# Patient Record
Sex: Female | Born: 1965 | Race: Black or African American | Hispanic: No | Marital: Single | Smoking: Never smoker
Health system: Southern US, Community
[De-identification: ages and names within clinical notes are randomized; demographics above are authoritative.]

## PROBLEM LIST (undated history)

## (undated) DIAGNOSIS — E559 Vitamin D deficiency, unspecified: Secondary | ICD-10-CM

## (undated) DIAGNOSIS — K219 Gastro-esophageal reflux disease without esophagitis: Secondary | ICD-10-CM

## (undated) HISTORY — PX: ULNAR COLLATERAL LIGAMENT REPAIR: SHX6159

## (undated) HISTORY — DX: Vitamin D deficiency, unspecified: E55.9

---

## 2003-05-02 HISTORY — PX: PARTIAL HYSTERECTOMY: SHX80

## 2013-05-01 HISTORY — PX: CHOLECYSTECTOMY: SHX55

## 2014-11-26 ENCOUNTER — Other Ambulatory Visit (HOSPITAL_COMMUNITY)
Admission: RE | Admit: 2014-11-26 | Discharge: 2014-11-26 | Disposition: A | Discharge status: Home / Self Care | Payer: Managed Care, Other (non HMO) | Source: Ambulatory Visit | Attending: Dermatology | Admitting: Dermatology

## 2014-11-26 DIAGNOSIS — L659 Nonscarring hair loss, unspecified: Secondary | ICD-10-CM | POA: Diagnosis not present

## 2014-11-26 DIAGNOSIS — L299 Pruritus, unspecified: Secondary | ICD-10-CM | POA: Diagnosis present

## 2014-11-26 DIAGNOSIS — L989 Disorder of the skin and subcutaneous tissue, unspecified: Secondary | ICD-10-CM | POA: Insufficient documentation

## 2014-11-26 LAB — TSH: TSH: 2.994 u[IU]/mL (ref 0.350–4.500)

## 2014-11-26 LAB — FERRITIN: FERRITIN: 42 ng/mL (ref 11–307)

## 2014-11-27 LAB — T4: T4 TOTAL: 6 ug/dL (ref 4.5–12.0)

## 2014-11-28 LAB — T3 UPTAKE: T3 UPTAKE RATIO: 26 % (ref 24–39)

## 2019-03-13 ENCOUNTER — Other Ambulatory Visit: Payer: Self-pay | Admitting: Family Medicine

## 2019-03-13 DIAGNOSIS — R519 Headache, unspecified: Secondary | ICD-10-CM

## 2019-03-13 DIAGNOSIS — R42 Dizziness and giddiness: Secondary | ICD-10-CM

## 2019-03-13 DIAGNOSIS — R569 Unspecified convulsions: Secondary | ICD-10-CM

## 2019-04-02 ENCOUNTER — Ambulatory Visit
Admission: RE | Admit: 2019-04-02 | Discharge: 2019-04-02 | Disposition: A | Discharge status: Home / Self Care | Payer: Managed Care, Other (non HMO) | Source: Ambulatory Visit | Attending: Family Medicine | Admitting: Family Medicine

## 2019-04-02 ENCOUNTER — Other Ambulatory Visit: Payer: Self-pay

## 2019-04-02 DIAGNOSIS — R569 Unspecified convulsions: Secondary | ICD-10-CM

## 2019-04-02 DIAGNOSIS — R42 Dizziness and giddiness: Secondary | ICD-10-CM

## 2019-04-02 DIAGNOSIS — R519 Headache, unspecified: Secondary | ICD-10-CM

## 2019-04-02 MED ORDER — GADOBENATE DIMEGLUMINE 529 MG/ML IV SOLN
20.0000 mL | Freq: Once | INTRAVENOUS | Status: AC | PRN
  Administered 2019-04-02: 20 mL via INTRAVENOUS

## 2019-05-02 DIAGNOSIS — R7303 Prediabetes: Secondary | ICD-10-CM

## 2019-05-02 HISTORY — DX: Prediabetes: R73.03

## 2019-05-22 ENCOUNTER — Encounter: Payer: Self-pay | Admitting: Neurology

## 2019-05-22 ENCOUNTER — Ambulatory Visit: Payer: BC Managed Care – PPO | Admitting: Neurology

## 2019-05-22 ENCOUNTER — Other Ambulatory Visit: Payer: Self-pay

## 2019-05-22 VITALS — BP 141/90 | HR 82 | Temp 97.6°F | Ht 70.5 in | Wt 322.0 lb

## 2019-05-22 DIAGNOSIS — R42 Dizziness and giddiness: Secondary | ICD-10-CM

## 2019-05-22 DIAGNOSIS — G44099 Other trigeminal autonomic cephalgias (TAC), not intractable: Secondary | ICD-10-CM

## 2019-05-22 DIAGNOSIS — G44021 Chronic cluster headache, intractable: Secondary | ICD-10-CM | POA: Diagnosis not present

## 2019-05-22 MED ORDER — INDOMETHACIN ER 75 MG PO CPCR
75.0000 mg | ORAL_CAPSULE | Freq: Two times a day (BID) | ORAL | 1 refills | Status: DC
Start: 2019-05-22 — End: 2023-09-07

## 2019-05-22 MED ORDER — MECLIZINE HCL 25 MG PO TABS: 25.0000 mg | ORAL_TABLET | Freq: Two times a day (BID) | ORAL | 1 refills | Status: DC | PRN

## 2019-05-22 NOTE — Patient Instructions (Signed)
Meclizine twice daily as needed Indomethacin twice daily email me in 5-7 days with update   Indomethacin-Responsive Headache, Adult An indomethacin-responsive headache is a headache that gets better when you take indomethacin. Indomethacin is a kind of NSAID (nonsteroidal anti-inflammatory drug). Indomethacin can quickly stop the pain from some kinds of headaches, such as:  Paroxysmal hemicrania. This is a series of short, severe headaches, usually on just one side of the head.  Hemicrania continua. Pain is nonstop and on one side of the face.  Primary exertional headache. Exercise sets off these headaches.  Primary cough headache. Pain may come from pressure in the brain when coughing or straining. What are the causes? The exact cause of this condition is not known. Certain conditions may start (trigger) a headache. They include:  Moving the head in certain ways.  Stress.  Pressure on sensitive areas of the neck.  Drinking alcohol.  Exercise.  Coughing and sneezing. What increases the risk? The following factors may make you more likely to develop this condition:  Being 52 years of age or older.  Having a serious head injury.  Having migraine headaches.  Having a family history of this condition. What are the signs or symptoms? Symptoms of this condition depend on the kind of headache you have.  Paroxysmal hemicrania: ? Having about 10 headaches a day. Each may last from a few minutes to 2 hours. ? Severe, pounding pain. ? Pain usually on just one side of the head. It often centers around the eye or in the forehead. ? A watery eye, which may become red or swollen. ? A droopy or swollen eyelid. ? Sweating and having a red or pinkish face. ? A stuffy, runny nose.  Hemicrania continua: ? All-day headache. This may occur daily for at least 3 months. Then there may be no headaches for weeks or months. ? Pain that gets worse several times during the day. ? Pain in the  face, on one side only. It almost always occurs on the same side. ? A watery eye. It may also become droopy, red, and swollen. ? A stuffy, runny nose. ? Pain that gets worse with sound or light.  Primary exertional headache: ? Pain during physical activity. ? Pounding or throbbing pain. ? Pain that lasts for 5 minutes to 48 hours, or sometimes longer.  Primary cough headache: ? Pain that starts after coughing, sneezing, or straining. ? Sharp, stabbing pain. ? Pain on both sides of the head. It is often worse in the back of the head. ? Pain that is severe for a few minutes and then dull for several hours. How is this diagnosed? This condition may be diagnosed based on:  Symptoms and medical history. Your health care provider will ask you questions about your headaches.  Physical exam.  Tests that may include: ? Blood and urine tests. ? Spinal tap (lumbar puncture). This tests a sample of fluid from your spine. The test checks for infection, bleeding in your brain (brain hemorrhage), or extra pressure inside your skull. ? Ultrasound, MRI, CT scan, or other imaging tests. If no medical condition is causing your headaches, you will be given indomethacin. If yours is an indomethacin-responsive headache, your symptoms should go away quickly. How is this treated?  By taking indomethacin.  With other medicines: ? To prevent or treat stomach ulcers. ? To relieve stomachache or heartburn (antacids). ? For nausea. Follow these instructions at home: Lifestyle  Rest in a dark, quiet room.  Put a  cool, damp washcloth on your head or face.  Get plenty of sleep. Most adults should get at least 7-9 hours of sleep each night.  Eat on a regular schedule. Do not skip meals.  Limit alcohol intake to no more than 1 drink per day for non-pregnant women and 2 drinks per day for men. One drink equals 12 ounces of beer, 5 ounces of wine, or 1 ounces of hard liquor.  Do not use any products  that contain nicotine or tobacco, such as cigarettes and e-cigarettes. If you need help quitting, ask your health care provider. Headache diary Keep a headache diary. This will help you and your health care provider determine what is triggering your headaches. Each time you have a headache, write down:  When it started and stopped. Include the day and time.  How it felt.  Any triggers, such as noise, stress, or foods.  Any medicines you took.  General instructions  Take over-the-counter and prescription medicines only as told by your health care provider. ? Do not take other NSAIDs, such as ibuprofen, with indomethacin.  Tell your health care provider about all medicines you are taking, including vitamins, herbs, eye drops, creams, and over-the-counter medicines.  Keep all follow-up visits as told by your health care provider. This is important. Contact a health care provider if:  Your pain continues even with treatment.  You have nausea.  You have a fever. Get help right away if:  You have bad stomach pain or vomiting.  You vomit blood.  You have blood in your stool.  You have chest pain.  You have any symptoms of a stroke. "BE FAST" is an easy way to remember the main warning signs of a stroke: ? B - Balance. Signs are dizziness, sudden trouble walking, or loss of balance. ? E - Eyes. Signs are trouble seeing or a sudden change in vision. ? F - Face. Signs are sudden weakness or numbness of the face, or the face or eyelid drooping on one side. ? A - Arms. Signs are weakness or numbness in an arm. This happens suddenly and usually on one side of the body. ? S - Speech. Signs are sudden trouble speaking, slurred speech, or trouble understanding what people say. ? T - Time. Time to call emergency services. Write down what time symptoms started.  You have other signs of a stroke, such as: ? A sudden, severe headache. ? Nausea or vomiting. ? Seizure. Summary  An  indomethacin-responsive headache is a headache that gets better when you take indomethacin, a medicine that stops inflammation.  The exact cause of this condition is not known, but there are certain conditions that may start (trigger) a headache.  Keep a headache diary to help your health care provider determine your triggers.  Treatment of this condition includes indomethacin, but it may include other medicines to relieve other symptoms. This information is not intended to replace advice given to you by your health care provider. Make sure you discuss any questions you have with your health care provider. Document Revised: 06/10/2018 Document Reviewed: 04/28/2017 Elsevier Patient Education  2020 [employer].

## 2019-05-22 NOTE — Progress Notes (Signed)
[facility]    Provider:  Dr Lucia Gaskins Requesting Provider: Irena Reichmann, DO Primary Care Provider:  Irena Reichmann, DO  CC:  Headaches  HPI:  Karina Roy is a 54 y.o. female here as requested by Irena Reichmann, DO for headaches. PMHx pre-diabetes  Started about 20 after hitting her head, they start with dizziness, sharp pains more on the right behind the eye, her eye gets twitchy, watery, all day long continuous, no light sensitivity, no sound sensitivity, pain is sharp, brief, she has spinning, no nausea unless it is really bad, her maternal aunt had migraines but no cluster headaches, no migraines in mom or siblings, she doesn't have children. Alleve helps. She will get them every other day, always hours and hours and hours. She takes meclizine daily. She took sumatriptan and she couldn't talk, chest pain, dizziness. No other focal neurologic deficits, associated symptoms, inciting events or modifiable factors.  Reviewed notes, labs and imaging from outside physicians, which showed:  Personally reviewed MRI brain images and agree with the following: IMPRESSION: 1. No evidence of acute intracranial abnormality. 2. Single small focus of T2 hyperintensity within the left frontal lobe white matter. This is nonspecific, but may reflect minimal chronic small vessel ischemic disease. 3. No specific seizure focus is identified.  TSH normal  Review of Systems: Patient complains of symptoms per HPI as well as the following symptoms: headache. Pertinent negatives and positives per HPI. All others negative.   Social History   Socioeconomic History  . Marital status: Single    Spouse name: Not on file  . Number of children: 1  . Years of education: Not on file  . Highest education level: Some college, no degree  Occupational History  . Not on file  Tobacco Use  . Smoking status: Never Smoker  . Smokeless tobacco: Never Used  Substance and Sexual Activity  . Alcohol  use: Not Currently  . Drug use: Never  . Sexual activity: Not on file  Other Topics Concern  . Not on file  Social History Narrative   Lives at home with her son (nephew whom she has had custody of since he was young)   Right handed   Caffeine: tea 3-4 times a month, sodas 4-5 per week   Social Determinants of Health   Financial Resource Strain:   . Difficulty of Paying Living Expenses: Not on file  Food Insecurity:   . Worried About Programme researcher, broadcasting/film/video in the Last Year: Not on file  . Ran Out of Food in the Last Year: Not on file  Transportation Needs:   . Lack of Transportation (Medical): Not on file  . Lack of Transportation (Non-Medical): Not on file  Physical Activity:   . Days of Exercise per Week: Not on file  . Minutes of Exercise per Session: Not on file  Stress:   . Feeling of Stress : Not on file  Social Connections:   . Frequency of Communication with Friends and Family: Not on file  . Frequency of Social Gatherings with Friends and Family: Not on file  . Attends Religious Services: Not on file  . Active Member of Clubs or Organizations: Not on file  . Attends Banker Meetings: Not on file  . Marital Status: Not on file  Intimate Partner Violence:   . Fear of Current or Ex-Partner: Not on file  . Emotionally Abused: Not on file  . Physically Abused: Not on file  . Sexually Abused: Not  on file    Family History  Problem Relation Age of Onset  . Lung cancer Maternal Grandmother        smoker  . Diabetes Other        mothers side   . High blood pressure Other        mother's side   . Diabetes Maternal Aunt   . Migraines Maternal Aunt        when younger, required hospital admission    Past Medical History:  Diagnosis Date  . Pre-diabetes 05/2019   A1c 6.2 or 6.3 per pt report  . Vitamin D deficiency     Patient Active Problem List   Diagnosis Date Noted  . 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 CHOLECYSTECTOMY  2015  . PARTIAL HYSTERECTOMY  2005   still has ovaries    Current Outpatient Medications  Medication Sig Dispense Refill  . ibuprofen (ADVIL) 600 MG tablet Take 600 mg by mouth every 6 (six) hours as needed for pain.    . indomethacin (INDOCIN SR) 75 MG CR capsule Take 1 capsule (75 mg total) by mouth 2 (two) times daily with a meal. 60 capsule 1  . meclizine (ANTIVERT) 25 MG tablet Take 1-2 tablets (25-50 mg total) by mouth 2 (two) times daily as needed for dizziness. 120 tablet 1  . Vitamin D, Ergocalciferol, (DRISDOL) 1.25 MG (50000 UNIT) CAPS capsule Take 50,000 Units by mouth once a week.     No current facility-administered medications for this visit.    Allergies as of 05/22/2019 - Review Complete 05/22/2019  Allergen Reaction Noted  . Sumatriptan  05/22/2019    Vitals: BP (!) 141/90 (BP Location: Right Arm, Patient Position: Sitting, Cuff Size: Large)   Pulse 82   Temp 97.6 F (36.4 C) Comment: taken at front  Ht 5' 10.5" (1.791 m)   Wt (!) 322 lb (146.1 kg)   BMI 45.55 kg/m  Last Weight:  Wt Readings from Last 1 Encounters:  05/22/19 (!) 322 lb (146.1 kg)   Last Height:   Ht Readings from Last 1 Encounters:  05/22/19 5' 10.5" (1.791 m)     Physical exam: Exam: Gen: NAD, conversant, well nourised, morbidly obese, well groomed                     CV: RRR, no MRG. No Carotid Bruits. No peripheral edema, warm, nontender Eyes: Conjunctivae clear without exudates or hemorrhage  Neuro: Detailed Neurologic Exam  Speech:    Speech is normal; fluent and spontaneous with normal comprehension.  Cognition:    The patient is oriented to person, place, and time;     recent and remote memory intact;     language fluent;     normal attention, concentration,     fund of knowledge Cranial Nerves:    The pupils are equal, round, and reactive to light. The fundi are normal and spontaneous venous pulsations are present.  Visual fields are full to finger confrontation. Extraocular movements are intact. Trigeminal sensation is intact and the muscles of mastication are normal. The face is symmetric. The palate elevates in the midline. Hearing intact. Voice is normal. Shoulder shrug is normal. The tongue has normal motion without fasciculations.   Coordination:    Normal finger to nose    Gait:    Wide based due to extremely large body habitus  Motor Observation:    No asymmetry, no  atrophy, and no involuntary movements noted. Tone:    Normal muscle tone.    Posture:    Posture is normal. normal erect    Strength:    Strength is V/V in the upper and lower limbs.      Sensation: intact to LT     Reflex Exam:  DTR's:    Deep tendon reflexes in the upper and lower extremities are normal bilaterally.   Toes:    The toes are downgoing bilaterally.   Clonus:    Clonus is absent.    Assessment/Plan: This is a very lovely 54 year old female who is here for headaches that appear to be in the trigeminal autonomic cephalalgia group.  She describes the headache on the right side only, periorbital and temple, severe and sharp, with right eye ptosis and lacrimation, without significant migrainous features.  The most common cause of this kind of headaches are cluster headaches however she reports these headaches can continue all day long uninterrupted and continuous, and cluster headaches by definition are 15 to 180 minutes in duration.  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  She states she has done very well with meclizine as well so I am happy to refill that at a higher dose that she can take as needed.  If the indomethacin it does not improve her headache then I would try verapamil which is first-line for cluster headaches.  Patient is going to contact me in 5 to 7 days to let me know  if she is indomethacin responsive.  MRI of the brain was normal.  Orders Placed This Encounter  Procedures  . CBC  . Comprehensive metabolic panel   Meds ordered this encounter  Medications  . meclizine (ANTIVERT) 25 MG tablet    Sig: Take 1-2 tablets (25-50 mg total) by mouth 2 (two) times daily as needed for dizziness.    Dispense:  120 tablet    Refill:  1  . indomethacin (INDOCIN SR) 75 MG CR capsule    Sig: Take 1 capsule (75 mg total) by mouth 2 (two) times daily with a meal.    Dispense:  60 capsule    Refill:  1    Cc: Irena Reichmann, DO,    Naomie Dean, MD  [facility] [address] [place], [place] [address]  Phone [phone] Fax [phone]

## 2019-05-23 LAB — COMPREHENSIVE METABOLIC PANEL
ALT: 23 IU/L (ref 0–32)
AST: 17 IU/L (ref 0–40)
Albumin/Globulin Ratio: 1.4 (ref 1.2–2.2)
Albumin: 4.1 g/dL (ref 3.8–4.9)
Alkaline Phosphatase: 69 IU/L (ref 39–117)
BUN/Creatinine Ratio: 12 (ref 9–23)
BUN: 9 mg/dL (ref 6–24)
Bilirubin Total: <0.2 mg/dL (ref 0.0–1.2)
CO2: 23 mmol/L (ref 20–29)
Calcium: 10.5 mg/dL — ABNORMAL HIGH (ref 8.7–10.2)
Chloride: 107 mmol/L — ABNORMAL HIGH (ref 96–106)
Creatinine, Ser: 0.73 mg/dL (ref 0.57–1.00)
GFR calc Af Amer: 110 mL/min/{1.73_m2} (ref >59)
GFR calc non Af Amer: 96 mL/min/{1.73_m2} (ref >59)
Globulin, Total: 2.9 g/dL (ref 1.5–4.5)
Glucose: 100 mg/dL — ABNORMAL HIGH (ref 65–99)
Potassium: 4.4 mmol/L (ref 3.5–5.2)
Sodium: 143 mmol/L (ref 134–144)
Total Protein: 7 g/dL (ref 6.0–8.5)

## 2019-05-23 LAB — CBC
Hematocrit: 39 % (ref 34.0–46.6)
Hemoglobin: 12.8 g/dL (ref 11.1–15.9)
MCH: 27.7 pg (ref 26.6–33.0)
MCHC: 32.8 g/dL (ref 31.5–35.7)
MCV: 84 fL (ref 79–97)
Platelets: 259 10*3/uL (ref 150–450)
RBC: 4.62 x10E6/uL (ref 3.77–5.28)
RDW: 14.1 % (ref 11.7–15.4)
WBC: 4.3 10*3/uL (ref 3.4–10.8)

## 2019-05-23 NOTE — Progress Notes (Signed)
Labs unremarkable thanks

## 2019-05-27 ENCOUNTER — Telehealth: Payer: Self-pay | Admitting: *Deleted

## 2019-05-27 NOTE — Telephone Encounter (Signed)
Called pt, LVM (ok per DPR) advising labs are unremarkable, no concerns. Left office number in message for call back if she has any questions.

## 2019-05-27 NOTE — Telephone Encounter (Signed)
-----   Message from Anson Fret, MD sent at 05/23/2019 10:12 AM EST ----- Labs unremarkable thanks

## 2021-01-21 IMAGING — MR MR HEAD WO/W CM
14 series · 48 of 48 positions shown · IV contrast (multihance)
Comparison: No pertinent prior studies available for comparison.

CLINICAL DATA: Dizziness. Convulsions, unspecified convulsion type.
Non intractable headache, unspecified chronicity pattern,
unspecified headache type. Additional history provided: Headaches
and dizziness for 8-9 months. History of seizures as a teenager.
Fainting, difficulty walking.

EXAM:
MRI HEAD WITHOUT AND WITH CONTRAST
TECHNIQUE: Multiplanar, multiecho pulse sequences of the brain and surrounding
structures were obtained without and with intravenous contrast.
CONTRAST:  20mL MULTIHANCE GADOBENATE DIMEGLUMINE 529 MG/ML IV SOLN

[Series 5: T1 · sagittal · 4.0mm · 0.75mm/px · 2 of 31 slices shown (1 of 3)]
[im 1/31]
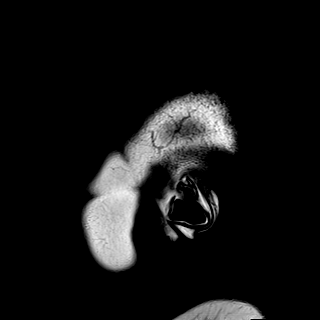
[im 31/31]
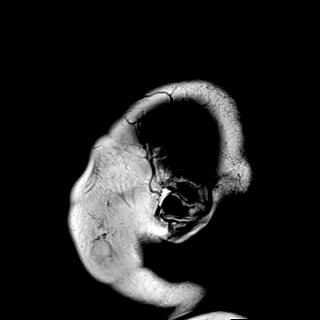

[Series 6: DWI · axial · 3.0mm · 1.44mm/px · z∈[-49,+92]mm · 5 of 88 slices shown (1 of 4)]
[im 1/88]
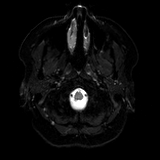
[im 22/88]
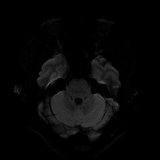
[im 44/88]
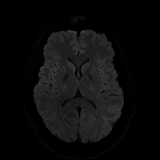
[im 66/88]
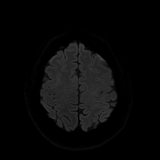
[im 88/88]
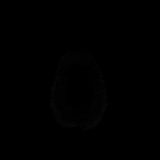

[Series 7: DWI · axial · 3.0mm · 1.44mm/px · z∈[-49,+92]mm · 2 of 44 slices shown (2 of 4)]
[im 1/44]
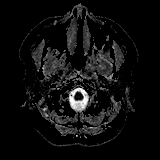
[im 44/44]
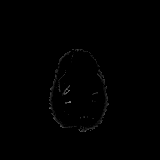

[Series 8: DWI · coronal · 5.0mm · 1.44mm/px · 4 of 64 slices shown (3 of 4)]
[im 1/64]
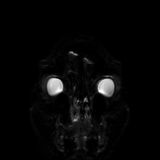
[im 22/64]
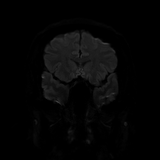
[im 43/64]
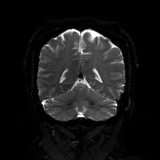
[im 64/64]
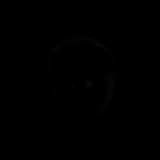

[Series 9: DWI · coronal · 5.0mm · 1.44mm/px · 2 of 32 slices shown (4 of 4)]
[im 1/32]
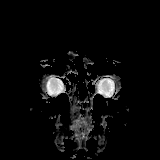
[im 32/32]
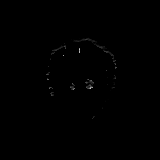

[Series 10: T2 · axial · 4.0mm · 0.36mm/px · z∈[-55,+90]mm · 2 of 29 slices shown (1 of 2)]
[im 1/29]
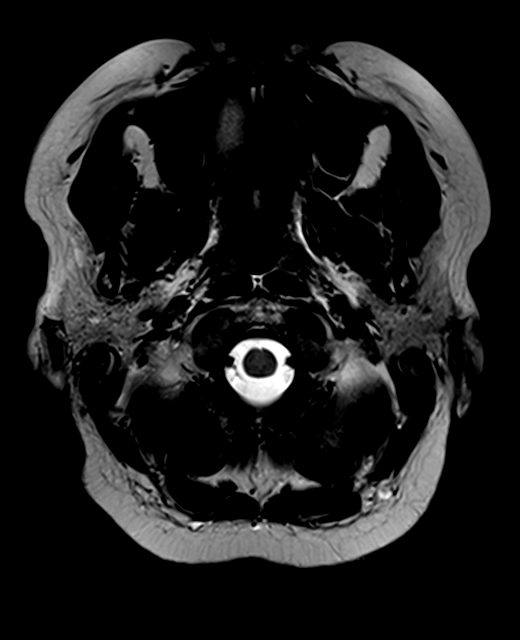
[im 29/29]
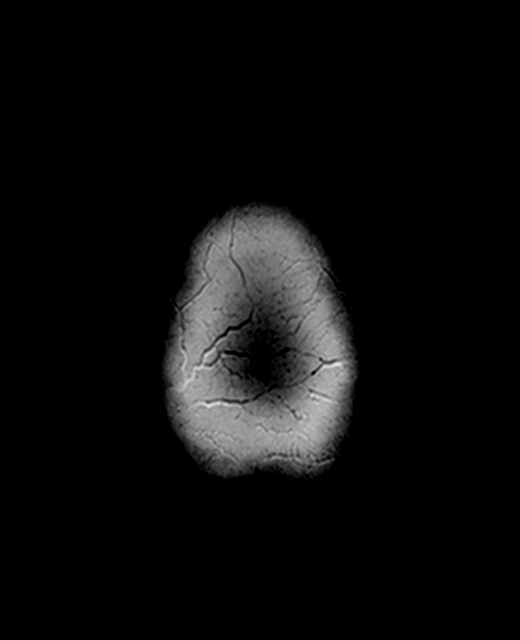

[Series 11: FLAIR · axial · 3.0mm · 0.72mm/px · 1 of 26 slices shown (1 of 2)]
[im 1/26]
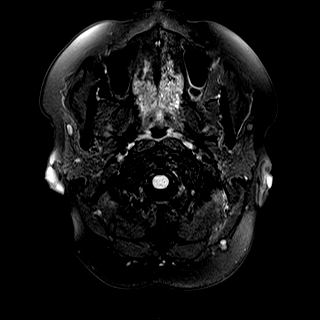

[Series 13: swi_images · axial · 1.5mm · 0.90mm/px · z∈[-53,+89]mm · 5 of 96 slices shown]
[im 1/96]
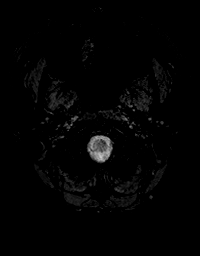
[im 24/96]
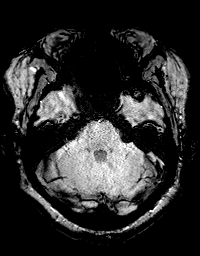
[im 48/96]
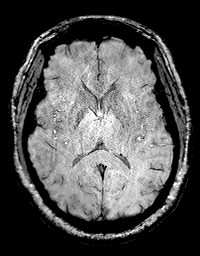
[im 72/96]
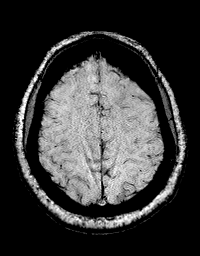
[im 96/96]
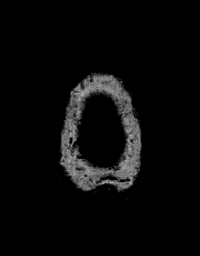

[Series 14: T1 · axial · 1.0mm · 0.94mm/px · z∈[-71,+88]mm · 9 of 160 slices shown (2 of 3)]
[im 1/160]
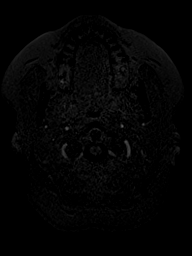
[im 20/160]
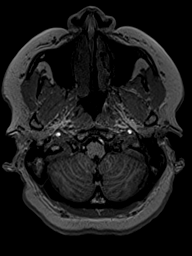
[im 40/160]
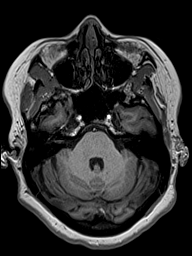
[im 60/160]
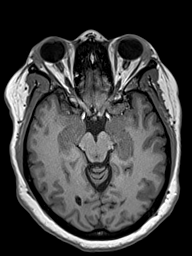
[im 80/160]
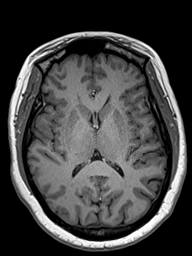
[im 100/160]
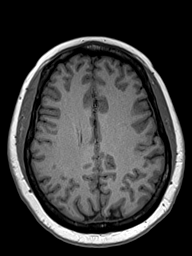
[im 120/160]
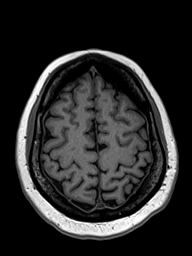
[im 140/160]
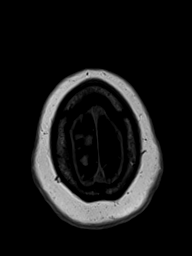
[im 160/160]
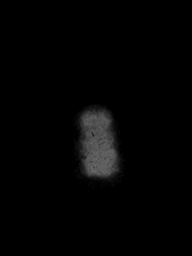

[Series 15: T2 · coronal · 3.0mm · 0.47mm/px · 1 of 25 slices shown (2 of 2)]
[im 1/25]
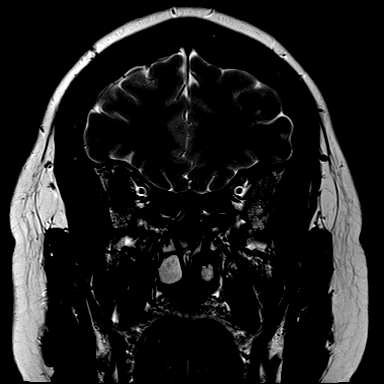

[Series 16: FLAIR · coronal · 3.0mm · 0.56mm/px · 2 of 30 slices shown (2 of 2)]
[im 1/30]
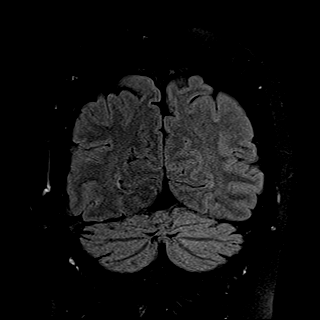
[im 30/30]
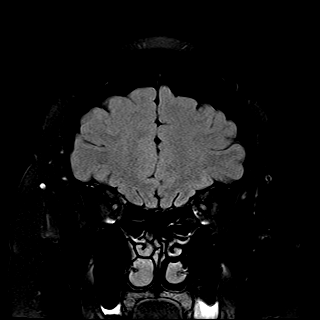

[Series 17: T2 post-contrast · coronal · 4.0mm · 0.36mm/px · 2 of 35 slices shown]
[im 1/35]
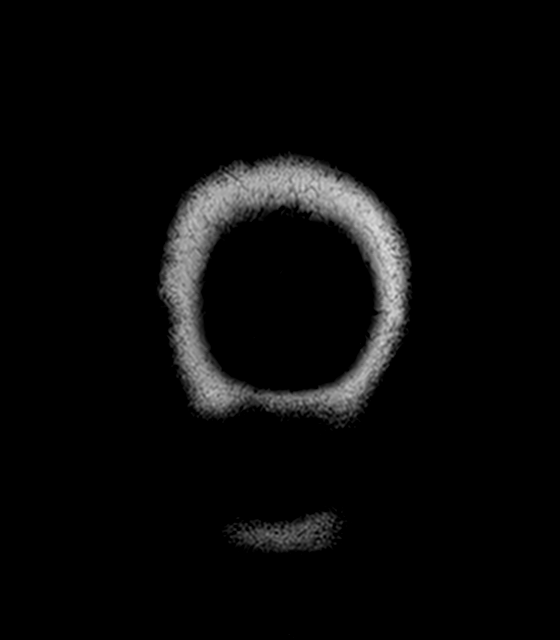
[im 35/35]
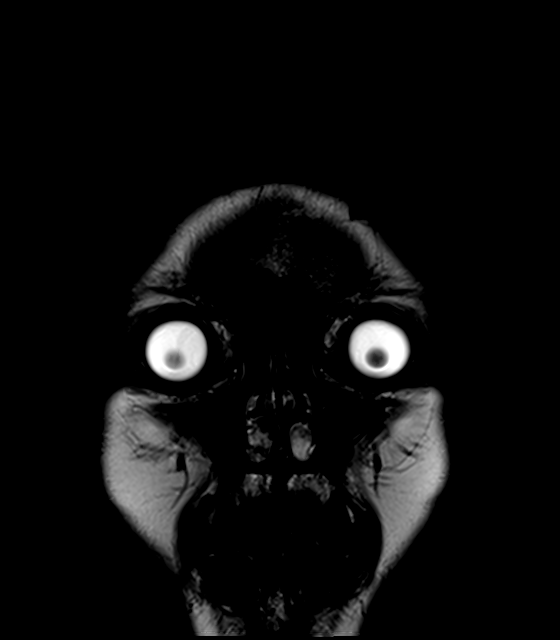

[Series 18: T1 · axial · 1.0mm · 0.94mm/px · z∈[-71,+88]mm · 9 of 160 slices shown (3 of 3)]
[im 1/160]
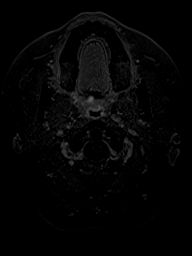
[im 20/160]
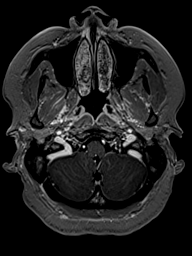
[im 40/160]
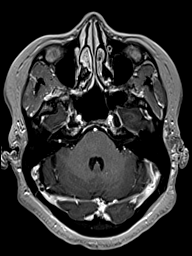
[im 60/160]
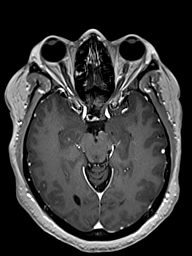
[im 80/160]
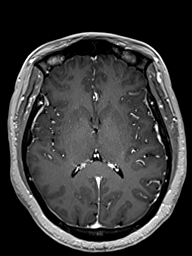
[im 100/160]
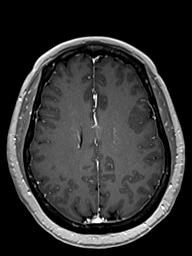
[im 120/160]
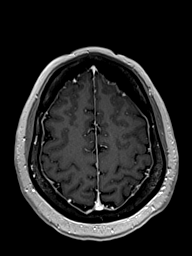
[im 140/160]
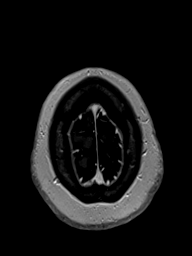
[im 160/160]
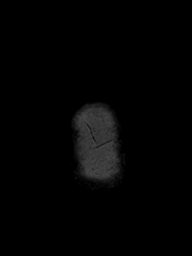

[Series 19: T1 post-contrast · coronal · 4.0mm · 0.72mm/px · 2 of 35 slices shown]
[im 1/35]
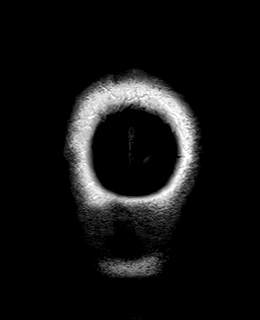
[im 35/35]
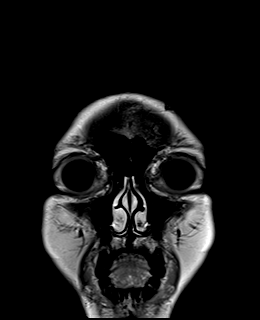

[48 of 48 positions shown; findings below may reference images not displayed]

FINDINGS: Brain:

There is no evidence of acute infarct.

No evidence of intracranial mass.

No midline shift or extra-axial fluid collection.

No chronic intracranial blood products.

There is a single small nonspecific focus of T2/FLAIR hyperintensity
within the left frontal lobe white matter (series 11, image 19). No
other focal parenchymal signal abnormality identified. The
hippocampi are symmetric in size and signal.

Cerebral volume is normal.  Partially empty sella turcica.

No abnormal intracranial enhancement.

Vascular: Flow voids maintained within the proximal large arterial
vessels. Expected enhancement within the dural venous sinuses

Skull and upper cervical spine: No focal marrow lesion

Sinuses/Orbits: Visualized orbits demonstrate no acute abnormality.
Trace ethmoid sinus mucosal thickening. No significant mastoid
effusion.
IMPRESSION: 1. No evidence of acute intracranial abnormality.
2. Single small focus of T2 hyperintensity within the left frontal
lobe white matter. This is nonspecific, but may reflect minimal
chronic small vessel ischemic disease.
3. No specific seizure focus is identified.

## 2023-09-04 ENCOUNTER — Ambulatory Visit: Payer: Self-pay | Admitting: Surgery

## 2023-09-06 ENCOUNTER — Other Ambulatory Visit (HOSPITAL_COMMUNITY): Payer: Self-pay | Admitting: Surgery

## 2023-09-06 DIAGNOSIS — E210 Primary hyperparathyroidism: Secondary | ICD-10-CM

## 2023-09-12 ENCOUNTER — Encounter: Payer: Self-pay | Admitting: Surgery

## 2023-09-12 DIAGNOSIS — E210 Primary hyperparathyroidism: Secondary | ICD-10-CM | POA: Diagnosis present

## 2023-09-12 NOTE — H&P (Signed)
 REFERRING PHYSICIAN: Theresa Flank, DO  PROVIDER: Kit Mollett Arcola Kocher, MD   Chief Complaint: New Consultation (Primary Hyperthyroidism)  History of Present Illness:  Patient is referred by Dr. Jannis Merchant for surgical evaluation and management of newly diagnosed primary hyperparathyroidism. Patient's endocrinologist is Dr. Tasia Farr. Patient's gynecologist is Dr. Thora Flint. Patient was noted approximately 2 years ago to have elevated serum calcium levels. More recently her calcium levels have remained elevated as high as 11.2. Patient had intact PTH level as high as 84. Vitamin D level is near normal at 28.0. Patient was seen and evaluated by endocrinology. She was referred for nuclear medicine parathyroid scan which was performed August 01, 2023 at [facility] in [place], [place] . This study demonstrated immediate and delayed radiotracer uptake on the right side relatively centrally consistent with a parathyroid adenoma. Patient has not had additional imaging studies. She has had no prior head or neck surgery. There is no family history of endocrine neoplasms. Patient is not currently employed. She denies any significant fatigue. She does note bone and joint discomfort. She does have low back pain. She has had intermittent right sided abdominal pain. Patient denies nephrolithiasis. She presents today to discuss primary hyperparathyroidism and parathyroidectomy.  Review of Systems: A complete review of systems was obtained from the patient. I have reviewed this information and discussed as appropriate with the patient. See HPI as well for other ROS.  Review of Systems  Constitutional: Negative. Negative for malaise/fatigue.  HENT: Negative.  Eyes: Negative.  Respiratory: Negative.  Cardiovascular: Negative.  Gastrointestinal: Positive for abdominal pain.  Genitourinary: Negative.  Musculoskeletal: Positive for back pain and joint pain.  Skin: Negative.  Neurological:  Positive for weakness.  Endo/Heme/Allergies: Negative.  Psychiatric/Behavioral: Negative.    Medical History: Past Medical History:  Diagnosis Date  Anemia  Arthritis   Patient Active Problem List  Diagnosis  Primary hyperparathyroidism (CMS/HHS-HCC)   Past Surgical History:  Procedure Laterality Date  ENDOSCOPY OF BILIARY DUCT 03/24/2014  Procedure: ENDOSCOPY OF BILIARY DUCT; Surgeon: Alphonsa Asai, MD; Location: [facility]; Service: Gastroenterology;;  ENDOSCOPIC RETROGRADE CHOLANGIO-PANCREATOGRAPHY W/SPHINCTEROTOMY N/A 03/24/2014  Procedure: ENDOSCOPIC RETROGRADE CHOLANGIO-PANCREATOGRAPHY W/SPHINCTEROTOMY; Surgeon: Alphonsa Asai, MD; Location: [facility]; Service: Gastroenterology; Laterality: N/A;  HYSTERECTOMY    Allergies  Allergen Reactions  Sumatriptan Other (See Comments)  Couldn't talk, stuttering, chest pains, went to the hospital with a reaction.   Current Outpatient Medications on File Prior to Visit  Medication Sig Dispense Refill  aspirin 81 MG EC tablet Take 81 mg by mouth once daily   No current facility-administered medications on file prior to visit.   Family History  Problem Relation Age of Onset  High blood pressure (Hypertension) Mother  High blood pressure (Hypertension) Father  High blood pressure (Hypertension) Sister  High blood pressure (Hypertension) Brother    Social History   Tobacco Use  Smoking Status Never  Smokeless Tobacco Never    Social History   Socioeconomic History  Marital status: Single  Tobacco Use  Smoking status: Never  Smokeless tobacco: Never  Vaping Use  Vaping status: Never Used  Substance and Sexual Activity  Alcohol use: No  Drug use: No   Objective:   Vitals:  BP: (!) 152/94  Pulse: 108  Temp: 36.6 C (97.9 F)  SpO2: 99%  Weight: (!) 156.8 kg (345 lb 9.6 oz)  Height: 179.7 cm (5' 10.75")  PainSc: 0-No pain   Body mass index is 48.54  kg/m.  Physical  Exam   GENERAL APPEARANCE Comfortable, no acute issues Development: normal Gross deformities: none  SKIN Rash, lesions, ulcers: none Induration, erythema: none Nodules: none palpable  EYES Conjunctiva and lids: normal Pupils: equal  EARS, NOSE, MOUTH, THROAT External ears: no lesion or deformity External nose: no lesion or deformity Hearing: grossly normal  NECK Symmetric: yes Trachea: midline Thyroid : no palpable nodules in the thyroid  bed  CHEST/CV Not assessed  ABDOMEN Not assessed  GENITOURINARY/RECTAL Not assessed  MUSCULOSKELETAL Station and gait: normal Digits and nails: no clubbing or cyanosis Muscle strength: grossly normal all extremities Deformity: none  LYMPHATIC Cervical: none palpable Supraclavicular: none palpable  PSYCHIATRIC Oriented to person, place, and time: yes Mood and affect: normal for situation Judgment and insight: appropriate for situation   Assessment and Plan:   Primary hyperparathyroidism (CMS/HHS-HCC)  Patient is referred by her primary care physician and her endocrinologist for surgical evaluation and management of newly diagnosed primary hyperparathyroidism.  Patient provided with a copy of "Parathyroid Surgery: Treatment for Your Parathyroid Gland Problem", published by [employer], 12 pages. Book reviewed and explained to patient during visit today.  Today we reviewed her clinical history. We reviewed her recent laboratory studies. We reviewed the results of her recent sestamibi scan. We discussed parathyroidectomy as the definitive treatment for primary hyperparathyroidism. We discussed the procedure. We discussed the size and location of the surgical incision. We discussed doing this as an outpatient surgical procedure. We discussed risk and benefits including the risk of recurrent laryngeal nerve injury. We discussed her postoperative recovery and return to activities.  Prior to surgery I would like to  obtain an ultrasound examination of the neck. This may help to confirm the location of the adenoma, to rule out multi gland disease, and to rule out any evidence of thyroid  disease.   Oralee Billow, MD [facility] Surgery [facility] practice Office: [phone]

## 2023-09-13 NOTE — Progress Notes (Signed)
 COVID Vaccine received:  []  No [x]  Yes Date of any COVID positive Test in last 90 days: no PCP - Pete Brand DO Cardiologist - n/a  Chest x-ray -  EKG -   Stress Test -  ECHO -  Cardiac Cath -   Bowel Prep - [x]  No  []   Yes ______  Pacemaker / ICD device [x]  No []  Yes   Spinal Cord Stimulator:[x]  No []  Yes       History of Sleep Apnea? [x]  No []  Yes   CPAP used?- [x]  No []  Yes    Does the patient monitor blood sugar?          [x]  No []  Yes  []  N/A  Patient has: [x]  NO Hx DM   []  Pre-DM                 []  DM1  []   DM2 Does patient have a [employer] or Dexacom? []  No []  Yes   Fasting Blood Sugar Ranges-  Checks Blood Sugar _____ times a day  GLP1 agonist / usual dose - no GLP1 instructions:  SGLT-2 inhibitors / usual dose - no SGLT-2 instructions:   Blood Thinner / Instructions:no Aspirin Instructions:81 mg ASA. Last dose 09/13/23  Comments:   Activity level: Patient is able to climb a flight of ; [x]  No CP  [x]  No SOB,    Patient can  perform ADLs without assistance.   Anesthesia review:   Patient denies shortness of breath, fever, cough and chest pain at PAT appointment.  Patient verbalized understanding and agreement to the Pre-Surgical Instructions that were given to them at this PAT appointment. Patient was also educated of the need to review these PAT instructions again prior to his/her surgery.I reviewed the appropriate phone numbers to call if they have any and questions or concerns.

## 2023-09-13 NOTE — Patient Instructions (Signed)
 SURGICAL WAITING ROOM VISITATION  Patients having surgery or a procedure may have no more than 2 support people in the waiting area - these visitors may rotate.    Children under the age of 28 must have an adult with them who is not the patient.  Due to an increase in RSV and influenza rates and associated hospitalizations, children ages 58 and under may not visit patients in [facility] hospitals.  Visitors with respiratory illnesses are discouraged from visiting and should remain at home.  If the patient needs to stay at the hospital during part of their recovery, the visitor guidelines for inpatient rooms apply. Pre-op nurse will coordinate an appropriate time for 1 support person to accompany patient in pre-op.  This support person may not rotate.    Please refer to the [facility] website for the visitor guidelines for Inpatients (after your surgery is over and you are in a regular room).       Your procedure is scheduled on: 09/21/23   Report to [facility] Main Entrance    Report to admitting at 5:15 AM   Call this number if you have problems the morning of surgery [phone]   Do not eat food :After Midnight.   After Midnight you may have the following liquids until  4:30 AM DAY OF SURGERY  Water Non-Citrus Juices (without pulp, NO RED-Apple, White grape, White cranberry) Black Coffee (NO MILK/CREAM OR CREAMERS, sugar ok)  Clear Tea (NO MILK/CREAM OR CREAMERS, sugar ok) regular and decaf                             Plain Jell-O (NO RED)                                           Fruit ices (not with fruit pulp, NO RED)                                     Popsicles (NO RED)                                                               Sports drinks like Gatorade (NO RED)                  Oral Hygiene is also important to reduce your risk of infection.                                    Remember - BRUSH YOUR TEETH THE MORNING OF SURGERY WITH YOUR REGULAR  TOOTHPASTE   Stop all vitamins and herbal supplements 7 days before surgery.   Take these medicines the morning of surgery with A SIP OF WATER: none             You may not have any metal on your body including hair pins, jewelry, and body piercing             Do not wear make-up, lotions, powders,  perfumes/cologne, or deodorant  Do not wear nail polish including gel and S&S, artificial/acrylic nails, or any other type of covering on natural nails including finger and toenails. If you have artificial nails, gel coating, etc. that needs to be removed by a nail salon please have this removed prior to surgery or surgery may need to be canceled/ delayed if the surgeon/ anesthesia feels like they are unable to be safely monitored.   Do not shave  48 hours prior to surgery.    Do not bring valuables to the hospital. [place] IS NOT             RESPONSIBLE   FOR VALUABLES.   Contacts, glasses, dentures or bridgework may not be worn into surgery.  DO NOT BRING YOUR HOME MEDICATIONS TO THE HOSPITAL. PHARMACY WILL DISPENSE MEDICATIONS LISTED ON YOUR MEDICATION LIST TO YOU DURING YOUR ADMISSION IN THE HOSPITAL!    Patients discharged on the day of surgery will not be allowed to drive home.  Someone NEEDS to stay with you for the first 24 hours after anesthesia.   Special Instructions: Bring a copy of your healthcare power of attorney and living will documents the day of surgery if you haven't scanned them before.              Please read over the following fact sheets you were given: IF YOU HAVE QUESTIONS ABOUT YOUR PRE-OP INSTRUCTIONS PLEASE CALL [phone] Karina Roy   If you received a COVID test during your pre-op visit  it is requested that you wear a mask when out in public, stay away from anyone that may not be feeling well and notify your surgeon if you develop symptoms. If you test positive for Covid or have been in contact with anyone that has tested positive in the last 10 days please  notify you surgeon.    [facility] - Preparing for Surgery Before surgery, you can play an important role.  Because skin is not sterile, your skin needs to be as free of germs as possible.  You can reduce the number of germs on your skin by washing with CHG (chlorahexidine gluconate) soap before surgery.  CHG is an antiseptic cleaner which kills germs and bonds with the skin to continue killing germs even after washing. Please DO NOT use if you have an allergy to CHG or antibacterial soaps.  If your skin becomes reddened/irritated stop using the CHG and inform your nurse when you arrive at Short Stay. Do not shave (including legs and underarms) for at least 48 hours prior to the first CHG shower.  You may shave your face/neck.  Please follow these instructions carefully:  1.  Shower with CHG Soap the night before surgery and the  morning of surgery.  2.  If you choose to wash your hair, wash your hair first as usual with your normal  shampoo.  3.  After you shampoo, rinse your hair and body thoroughly to remove the shampoo.                             4.  Use CHG as you would any other liquid soap.  You can apply chg directly to the skin and wash.  Gently with a scrungie or clean washcloth.  5.  Apply the CHG Soap to your body ONLY FROM THE NECK DOWN.   Do   not use on face/ open  Wound or open sores. Avoid contact with eyes, ears mouth and   genitals (private parts).                       Wash face,  Genitals (private parts) with your normal soap.             6.  Wash thoroughly, paying special attention to the area where your    surgery  will be performed.  7.  Thoroughly rinse your body with warm water from the neck down.  8.  DO NOT shower/wash with your normal soap after using and rinsing off the CHG Soap.                9.  Pat yourself dry with a clean towel.            10.  Wear clean pajamas.            11.  Place clean sheets on your bed the night of your first  shower and do not  sleep with pets. Day of Surgery : Do not apply any lotions/deodorants the morning of surgery.  Please wear clean clothes to the hospital/surgery center.  FAILURE TO FOLLOW THESE INSTRUCTIONS MAY RESULT IN THE CANCELLATION OF YOUR SURGERY  PATIENT SIGNATURE_________________________________  NURSE SIGNATURE__________________________________  ________________________________________________________________________

## 2023-09-14 ENCOUNTER — Encounter (HOSPITAL_COMMUNITY): Payer: Self-pay

## 2023-09-14 ENCOUNTER — Other Ambulatory Visit: Payer: Self-pay

## 2023-09-14 ENCOUNTER — Encounter (HOSPITAL_COMMUNITY)
Admission: RE | Admit: 2023-09-14 | Discharge: 2023-09-14 | Disposition: A | Discharge status: Home / Self Care | Source: Ambulatory Visit | Attending: Surgery | Admitting: Surgery

## 2023-09-14 VITALS — BP 180/114 | HR 78 | Temp 98.5°F | Resp 20 | Ht 70.0 in | Wt 345.0 lb

## 2023-09-14 DIAGNOSIS — Z01818 Encounter for other preprocedural examination: Secondary | ICD-10-CM | POA: Diagnosis present

## 2023-09-14 HISTORY — DX: Gastro-esophageal reflux disease without esophagitis: K21.9

## 2023-09-14 LAB — CBC
HCT: 42.6 % (ref 36.0–46.0)
Hemoglobin: 13.4 g/dL (ref 12.0–15.0)
MCH: 28.3 pg (ref 26.0–34.0)
MCHC: 31.5 g/dL (ref 30.0–36.0)
MCV: 89.9 fL (ref 80.0–100.0)
Platelets: 218 10*3/uL (ref 150–400)
RBC: 4.74 MIL/uL (ref 3.87–5.11)
RDW: 14.7 % (ref 11.5–15.5)
WBC: 4.6 10*3/uL (ref 4.0–10.5)
nRBC: 0 % (ref 0.0–0.2)

## 2023-09-14 LAB — BASIC METABOLIC PANEL WITH GFR
Anion gap: 8 (ref 5–15)
BUN: 13 mg/dL (ref 6–20)
CO2: 24 mmol/L (ref 22–32)
Calcium: 10.4 mg/dL — ABNORMAL HIGH (ref 8.9–10.3)
Chloride: 108 mmol/L (ref 98–111)
Creatinine, Ser: 0.6 mg/dL (ref 0.44–1.00)
GFR, Estimated: >60 mL/min (ref >60)
Glucose, Bld: 109 mg/dL — ABNORMAL HIGH (ref 70–99)
Potassium: 4.4 mmol/L (ref 3.5–5.1)
Sodium: 140 mmol/L (ref 135–145)

## 2023-09-14 NOTE — Progress Notes (Signed)
 Pt. Here for PST appointment. Initial B/P was 174/114 then 20 minutes later it was 180/114. Other vitals were WNL. Pt. Denies H/A, blurred vision or feeling bad. Reported findings to PA Cornish. Instructed pt. To recheck B/P this evening and again Sat. And Sun. If bottom number is still elevated make an appointment with her primary MD. She verbalized understanding.

## 2023-09-19 ENCOUNTER — Ambulatory Visit (HOSPITAL_COMMUNITY)
Admission: RE | Admit: 2023-09-19 | Discharge: 2023-09-19 | Disposition: A | Discharge status: Home / Self Care | Source: Ambulatory Visit | Attending: Surgery | Admitting: Surgery

## 2023-09-19 DIAGNOSIS — E210 Primary hyperparathyroidism: Secondary | ICD-10-CM | POA: Insufficient documentation

## 2023-09-20 NOTE — Anesthesia Preprocedure Evaluation (Signed)
 Anesthesia Evaluation  Patient identified by MRN, date of birth, ID band Patient awake    Reviewed: Allergy & Precautions, H&P , NPO status , Patient's Chart, lab work & pertinent test results  Airway Mallampati: II  TM Distance: >3 FB Neck ROM: Full    Dental no notable dental hx.    Pulmonary neg pulmonary ROS, neg COPD   Pulmonary exam normal breath sounds clear to auscultation       Cardiovascular (-) hypertension(-) angina (-) Past MI negative cardio ROS Normal cardiovascular exam Rhythm:Regular Rate:Normal     Neuro/Psych  Headaches, neg Seizures  negative psych ROS   GI/Hepatic Neg liver ROS,GERD  ,,  Endo/Other    Class 3 obesityhyperparathyroidism  Renal/GU negative Renal ROS  negative genitourinary   Musculoskeletal negative musculoskeletal ROS (+)    Abdominal  (+) + obese  Peds negative pediatric ROS (+)  Hematology negative hematology ROS (+)   Anesthesia Other Findings   Reproductive/Obstetrics negative OB ROS                             Anesthesia Physical Anesthesia Plan  ASA: 3  Anesthesia Plan: General   Post-op Pain Management: Tylenol PO (pre-op)* and Celebrex PO (pre-op)*   Induction: Intravenous  PONV Risk Score and Plan: 3 and Ondansetron, Dexamethasone and Midazolam  Airway Management Planned: Oral ETT  Additional Equipment:   Intra-op Plan:   Post-operative Plan: Extubation in OR  Informed Consent: I have reviewed the patients History and Physical, chart, labs and discussed the procedure including the risks, benefits and alternatives for the proposed anesthesia with the patient or authorized representative who has indicated his/her understanding and acceptance.     Dental advisory given  Plan Discussed with: CRNA  Anesthesia Plan Comments:        Anesthesia Quick Evaluation

## 2023-09-21 ENCOUNTER — Ambulatory Visit (HOSPITAL_COMMUNITY): Payer: Self-pay | Admitting: Physician Assistant

## 2023-09-21 ENCOUNTER — Other Ambulatory Visit: Payer: Self-pay

## 2023-09-21 ENCOUNTER — Encounter (HOSPITAL_COMMUNITY): Payer: Self-pay | Admitting: Surgery

## 2023-09-21 ENCOUNTER — Ambulatory Visit (HOSPITAL_COMMUNITY)

## 2023-09-21 ENCOUNTER — Encounter (HOSPITAL_COMMUNITY)
Admission: RE | Disposition: A | Discharge status: Home / Self Care | Payer: Self-pay | Source: Ambulatory Visit | Attending: Surgery

## 2023-09-21 ENCOUNTER — Ambulatory Visit (HOSPITAL_COMMUNITY)
Admission: RE | Admit: 2023-09-21 | Discharge: 2023-09-21 | Disposition: A | Discharge status: Home / Self Care | Source: Ambulatory Visit | Attending: Surgery | Admitting: Surgery

## 2023-09-21 DIAGNOSIS — Z6841 Body Mass Index (BMI) 40.0 and over, adult: Secondary | ICD-10-CM | POA: Insufficient documentation

## 2023-09-21 DIAGNOSIS — E210 Primary hyperparathyroidism: Secondary | ICD-10-CM | POA: Diagnosis present

## 2023-09-21 DIAGNOSIS — E66813 Obesity, class 3: Secondary | ICD-10-CM | POA: Diagnosis not present

## 2023-09-21 DIAGNOSIS — G44099 Other trigeminal autonomic cephalgias (TAC), not intractable: Secondary | ICD-10-CM | POA: Diagnosis not present

## 2023-09-21 DIAGNOSIS — D351 Benign neoplasm of parathyroid gland: Secondary | ICD-10-CM | POA: Insufficient documentation

## 2023-09-21 HISTORY — PX: PARATHYROIDECTOMY: SHX19

## 2023-09-21 SURGERY — PARATHYROIDECTOMY
Anesthesia: General | Site: Neck | Laterality: Right

## 2023-09-21 MED ORDER — BUPIVACAINE HCL 0.25 % IJ SOLN
INTRAMUSCULAR | Status: DC | PRN
  Administered 2023-09-21: 10 mL

## 2023-09-21 MED ORDER — DROPERIDOL 2.5 MG/ML IJ SOLN: 0.6250 mg | Freq: Once | INTRAMUSCULAR | Status: DC | PRN

## 2023-09-21 MED ORDER — DEXAMETHASONE SODIUM PHOSPHATE 10 MG/ML IJ SOLN
INTRAMUSCULAR | Status: AC
  Filled 2023-09-21: qty 1

## 2023-09-21 MED ORDER — ORAL CARE MOUTH RINSE: 15.0000 mL | Freq: Once | OROMUCOSAL | Status: AC

## 2023-09-21 MED ORDER — CHLORHEXIDINE GLUCONATE CLOTH 2 % EX PADS: 6.0000 | MEDICATED_PAD | Freq: Once | CUTANEOUS | Status: DC

## 2023-09-21 MED ORDER — FENTANYL CITRATE PF 50 MCG/ML IJ SOSY
25.0000 ug | PREFILLED_SYRINGE | INTRAMUSCULAR | Status: DC | PRN
Start: 2023-09-21 — End: 2023-09-21

## 2023-09-21 MED ORDER — ONDANSETRON HCL 4 MG/2ML IJ SOLN
INTRAMUSCULAR | Status: DC | PRN
Start: 2023-09-21 — End: 2023-09-21
  Administered 2023-09-21: 4 mg via INTRAVENOUS

## 2023-09-21 MED ORDER — LIDOCAINE HCL (PF) 2 % IJ SOLN
INTRAMUSCULAR | Status: AC
  Filled 2023-09-21: qty 5

## 2023-09-21 MED ORDER — CELECOXIB 200 MG PO CAPS
200.0000 mg | ORAL_CAPSULE | Freq: Once | ORAL | Status: AC
  Administered 2023-09-21: 200 mg via ORAL
  Filled 2023-09-21: qty 1

## 2023-09-21 MED ORDER — PROPOFOL 10 MG/ML IV BOLUS
INTRAVENOUS | Status: AC
  Filled 2023-09-21: qty 20

## 2023-09-21 MED ORDER — ROCURONIUM BROMIDE 10 MG/ML (PF) SYRINGE
PREFILLED_SYRINGE | INTRAVENOUS | Status: DC | PRN
  Administered 2023-09-21: 80 mg via INTRAVENOUS

## 2023-09-21 MED ORDER — DEXAMETHASONE SODIUM PHOSPHATE 10 MG/ML IJ SOLN
INTRAMUSCULAR | Status: DC | PRN
Start: 2023-09-21 — End: 2023-09-21
  Administered 2023-09-21: 10 mg via INTRAVENOUS

## 2023-09-21 MED ORDER — ONDANSETRON HCL 4 MG/2ML IJ SOLN
INTRAMUSCULAR | Status: AC
  Filled 2023-09-21: qty 2

## 2023-09-21 MED ORDER — CEFAZOLIN SODIUM-DEXTROSE 2-4 GM/100ML-% IV SOLN
2.0000 g | INTRAVENOUS | Status: AC
  Administered 2023-09-21: 2 g via INTRAVENOUS
  Filled 2023-09-21: qty 100

## 2023-09-21 MED ORDER — 0.9 % SODIUM CHLORIDE (POUR BTL) OPTIME
TOPICAL | Status: DC | PRN
  Administered 2023-09-21: 1000 mL

## 2023-09-21 MED ORDER — OXYCODONE HCL 5 MG/5ML PO SOLN: 5.0000 mg | Freq: Once | ORAL | Status: AC | PRN

## 2023-09-21 MED ORDER — MIDAZOLAM HCL 2 MG/2ML IJ SOLN
INTRAMUSCULAR | Status: AC
  Filled 2023-09-21: qty 2

## 2023-09-21 MED ORDER — DEXMEDETOMIDINE HCL IN NACL 80 MCG/20ML IV SOLN
INTRAVENOUS | Status: DC | PRN
  Administered 2023-09-21: 4 ug via INTRAVENOUS

## 2023-09-21 MED ORDER — LIDOCAINE HCL (PF) 2 % IJ SOLN
INTRAMUSCULAR | Status: DC | PRN
  Administered 2023-09-21: 100 mg via INTRADERMAL

## 2023-09-21 MED ORDER — ACETAMINOPHEN 500 MG PO TABS
1000.0000 mg | ORAL_TABLET | Freq: Once | ORAL | Status: AC
  Administered 2023-09-21: 1000 mg via ORAL
  Filled 2023-09-21: qty 2

## 2023-09-21 MED ORDER — LACTATED RINGERS IV SOLN: INTRAVENOUS | Status: DC

## 2023-09-21 MED ORDER — SUGAMMADEX SODIUM 200 MG/2ML IV SOLN
INTRAVENOUS | Status: DC | PRN
  Administered 2023-09-21: 400 mg via INTRAVENOUS

## 2023-09-21 MED ORDER — TRAMADOL HCL 50 MG PO TABS: 50.0000 mg | ORAL_TABLET | Freq: Four times a day (QID) | ORAL | 0 refills | Status: AC | PRN

## 2023-09-21 MED ORDER — ACETAMINOPHEN 10 MG/ML IV SOLN: 1000.0000 mg | Freq: Once | INTRAVENOUS | Status: DC | PRN

## 2023-09-21 MED ORDER — OXYCODONE HCL 5 MG PO TABS
ORAL_TABLET | ORAL | Status: AC
  Filled 2023-09-21: qty 1

## 2023-09-21 MED ORDER — MIDAZOLAM HCL 2 MG/2ML IJ SOLN
INTRAMUSCULAR | Status: DC | PRN
  Administered 2023-09-21 (×2): 1 mg via INTRAVENOUS

## 2023-09-21 MED ORDER — BUPIVACAINE HCL (PF) 0.25 % IJ SOLN
INTRAMUSCULAR | Status: AC
  Filled 2023-09-21: qty 30

## 2023-09-21 MED ORDER — HEMOSTATIC AGENTS (NO CHARGE) OPTIME
TOPICAL | Status: DC | PRN
  Administered 2023-09-21: 1 via TOPICAL

## 2023-09-21 MED ORDER — PHENYLEPHRINE HCL (PRESSORS) 10 MG/ML IV SOLN
INTRAVENOUS | Status: DC | PRN
  Administered 2023-09-21: 80 ug via INTRAVENOUS

## 2023-09-21 MED ORDER — FENTANYL CITRATE (PF) 100 MCG/2ML IJ SOLN
INTRAMUSCULAR | Status: DC | PRN
  Administered 2023-09-21: 100 ug via INTRAVENOUS

## 2023-09-21 MED ORDER — OXYCODONE HCL 5 MG PO TABS
5.0000 mg | ORAL_TABLET | Freq: Once | ORAL | Status: AC | PRN
  Administered 2023-09-21: 5 mg via ORAL

## 2023-09-21 MED ORDER — ROCURONIUM BROMIDE 10 MG/ML (PF) SYRINGE
PREFILLED_SYRINGE | INTRAVENOUS | Status: AC
  Filled 2023-09-21: qty 10

## 2023-09-21 MED ORDER — PROPOFOL 10 MG/ML IV BOLUS
INTRAVENOUS | Status: DC | PRN
Start: 2023-09-21 — End: 2023-09-21
  Administered 2023-09-21: 150 ug via INTRAVENOUS
  Administered 2023-09-21: 50 ug via INTRAVENOUS

## 2023-09-21 MED ORDER — CHLORHEXIDINE GLUCONATE 0.12 % MT SOLN
15.0000 mL | Freq: Once | OROMUCOSAL | Status: AC
  Administered 2023-09-21: 15 mL via OROMUCOSAL

## 2023-09-21 MED ORDER — FENTANYL CITRATE (PF) 100 MCG/2ML IJ SOLN
INTRAMUSCULAR | Status: AC
  Filled 2023-09-21: qty 2

## 2023-09-21 SURGICAL SUPPLY — 29 items
ATTRACTOMAT 16X20 MAGNETIC DRP (DRAPES) ×1 IMPLANT
BAG COUNTER SPONGE SURGICOUNT (BAG) ×1 IMPLANT
BLADE SURG 15 STRL LF DISP TIS (BLADE) ×1 IMPLANT
CHLORAPREP W/TINT 26 (MISCELLANEOUS) ×1 IMPLANT
CLIP TI MEDIUM 6 (CLIP) ×2 IMPLANT
CLIP TI WIDE RED SMALL 6 (CLIP) ×2 IMPLANT
COVER SURGICAL LIGHT HANDLE (MISCELLANEOUS) ×1 IMPLANT
DERMABOND ADVANCED .7 DNX12 (GAUZE/BANDAGES/DRESSINGS) ×1 IMPLANT
DRAPE LAPAROTOMY T 98X78 PEDS (DRAPES) ×1 IMPLANT
DRAPE UTILITY XL STRL (DRAPES) ×1 IMPLANT
ELECT REM PT RETURN 15FT ADLT (MISCELLANEOUS) ×1 IMPLANT
GAUZE 4X4 16PLY ~~LOC~~+RFID DBL (SPONGE) ×1 IMPLANT
GLOVE SURG ORTHO 8.0 STRL STRW (GLOVE) ×1 IMPLANT
GOWN STRL REUS W/ TWL XL LVL3 (GOWN DISPOSABLE) ×3 IMPLANT
HEMOSTAT SURGICEL 2X4 FIBR (HEMOSTASIS) ×1 IMPLANT
ILLUMINATOR WAVEGUIDE N/F (MISCELLANEOUS) IMPLANT
KIT BASIN OR (CUSTOM PROCEDURE TRAY) ×1 IMPLANT
KIT TURNOVER KIT A (KITS) IMPLANT
NDL HYPO 22X1.5 SAFETY MO (MISCELLANEOUS) ×1 IMPLANT
NEEDLE HYPO 22X1.5 SAFETY MO (MISCELLANEOUS) ×1 IMPLANT
PACK BASIC VI WITH GOWN DISP (CUSTOM PROCEDURE TRAY) ×1 IMPLANT
PENCIL SMOKE EVACUATOR (MISCELLANEOUS) ×1 IMPLANT
SHEARS HARMONIC 9CM CVD (BLADE) IMPLANT
SUT MNCRL AB 4-0 PS2 18 (SUTURE) ×1 IMPLANT
SUT VIC AB 3-0 SH 18 (SUTURE) ×1 IMPLANT
SYR BULB IRRIG 60ML STRL (SYRINGE) ×1 IMPLANT
SYR CONTROL 10ML LL (SYRINGE) ×1 IMPLANT
TOWEL OR 17X26 10 PK STRL BLUE (TOWEL DISPOSABLE) ×1 IMPLANT
TUBING CONNECTING 10 (TUBING) ×1 IMPLANT

## 2023-09-21 NOTE — Transfer of Care (Signed)
 Immediate Anesthesia Transfer of Care Note  Patient: Karina Roy  Procedure(s) Performed: Procedure(s) with comments: PARATHYROIDECTOMY (Right) - RIGHT PARATHYROIDECTOMY  Patient Location: PACU  Anesthesia Type:General  Level of Consciousness: Patient easily awoken, comfortable, cooperative, following commands, responds to stimulation.   Airway & Oxygen Therapy: Patient spontaneously breathing, ventilating well, oxygen via simple oxygen mask.  Post-op Assessment: Report given to PACU RN, vital signs reviewed and stable, moving all extremities.   Post vital signs: Reviewed and stable.  Complications: No apparent anesthesia complications  Last Vitals:  Vitals Value Taken Time  BP 160/95 09/21/23 0855  Temp    Pulse 89 09/21/23 0857  Resp 16 09/21/23 0857  SpO2 100 % 09/21/23 0857  Vitals shown include unfiled device data.  Last Pain:  Vitals:   09/21/23 0601  TempSrc:   PainSc: 0-No pain      Patients Stated Pain Goal: 6 (09/21/23 0556)  Complications: No notable events documented.

## 2023-09-21 NOTE — Anesthesia Postprocedure Evaluation (Signed)
 Anesthesia Post Note  Patient: Karina Roy  Procedure(s) Performed: PARATHYROIDECTOMY (Right: Neck)     Patient location during evaluation: PACU Anesthesia Type: General Level of consciousness: awake and alert Pain management: pain level controlled Vital Signs Assessment: post-procedure vital signs reviewed and stable Respiratory status: spontaneous breathing, nonlabored ventilation, respiratory function stable and patient connected to nasal cannula oxygen Cardiovascular status: blood pressure returned to baseline and stable Postop Assessment: no apparent nausea or vomiting Anesthetic complications: no   No notable events documented.  Last Vitals:  Vitals:   09/21/23 1004 09/21/23 1015  BP:  (!) 159/95  Pulse: 88 83  Resp:  16  Temp:    SpO2: 95% 99%    Last Pain:  Vitals:   09/21/23 1015  TempSrc:   PainSc: 3                  Lethaniel Rave

## 2023-09-21 NOTE — Anesthesia Procedure Notes (Signed)
 Procedure Name: Intubation Date/Time: 09/21/2023 7:35 AM  Performed by: Manuela Sella, CRNAPre-anesthesia Checklist: Patient identified, Emergency Drugs available, Suction available and Patient being monitored Patient Re-evaluated:Patient Re-evaluated prior to induction Oxygen Delivery Method: Circle system utilized Preoxygenation: Pre-oxygenation with 100% oxygen Induction Type: IV induction Ventilation: Mask ventilation without difficulty Laryngoscope Size: Mac and 4 Grade View: Grade II Tube type: Oral Tube size: 7.0 mm Number of attempts: 1 Airway Equipment and Method: Stylet Placement Confirmation: ETT inserted through vocal cords under direct vision, positive ETCO2 and breath sounds checked- equal and bilateral Secured at: 22 cm Tube secured with: Tape Dental Injury: Teeth and Oropharynx as per pre-operative assessment

## 2023-09-21 NOTE — Interval H&P Note (Signed)
 History and Physical Interval Note:  09/21/2023 7:05 AM  Dail Drought  has presented today for surgery, with the diagnosis of PRIMARY HYPERPARATHYROIDISM.  The various methods of treatment have been discussed with the patient and family. After consideration of risks, benefits and other options for treatment, the patient has consented to    Procedure(s) with comments: PARATHYROIDECTOMY (Right) - RIGHT PARATHYROIDECTOMY as a surgical intervention.    The patient's history has been reviewed, patient examined, no change in status, stable for surgery.  I have reviewed the patient's chart and labs.  Questions were answered to the patient's satisfaction.    Oralee Billow, MD [facility] Surgery [facility] practice Office: [phone]   Oralee Billow

## 2023-09-21 NOTE — Discharge Instructions (Addendum)

## 2023-09-21 NOTE — Op Note (Signed)
 OPERATIVE REPORT - PARATHYROIDECTOMY  Preoperative diagnosis: Primary hyperparathyroidism  Postop diagnosis: Same  Procedure: Right superior minimally invasive parathyroidectomy  Surgeon:  Oralee Billow, MD  Anesthesia: General endotracheal  Estimated blood loss: Minimal  Preparation: ChloraPrep  Indications: Patient is referred by Dr. Jannis Merchant for surgical evaluation and management of newly diagnosed primary hyperparathyroidism. Patient's endocrinologist is Dr. Tasia Farr. Patient's gynecologist is Dr. Thora Flint. Patient was noted approximately 2 years ago to have elevated serum calcium levels. More recently her calcium levels have remained elevated as high as 11.2. Patient had intact PTH level as high as 84. Vitamin D level is near normal at 28.0. Patient was seen and evaluated by endocrinology. She was referred for nuclear medicine parathyroid scan which was performed August 01, 2023 at [facility] in [place], [place] . This study demonstrated immediate and delayed radiotracer uptake on the right side relatively centrally consistent with a parathyroid adenoma. Patient has not had additional imaging studies. She has had no prior head or neck surgery. There is no family history of endocrine neoplasms. Patient is not currently employed. She denies any significant fatigue. She does note bone and joint discomfort. She does have low back pain. She has had intermittent right sided abdominal pain. Patient denies nephrolithiasis. She presents today to discuss primary hyperparathyroidism and parathyroidectomy.   Procedure: The patient was prepared in the pre-operative holding area. The patient was brought to the operating room and placed in a supine position on the operating room table. Following administration of general anesthesia, the patient was positioned and then prepped and draped in the usual strict aseptic fashion. After ascertaining that an adequate level of anesthesia been achieved, a  neck incision was made with a #15 blade. Dissection was carried through subcutaneous tissues and platysma. Hemostasis was obtained with the electrocautery. Skin flaps were developed circumferentially and a Weitlander retractor was placed for exposure.  Strap muscles were incised in the midline. Strap muscles were reflected laterally exposing the thyroid  lobe. With gentle blunt dissection the thyroid  lobe was mobilized.  Dissection was carried posteriorly and an enlarged parathyroid gland was identified superior to the inferior thyroid  artery. It was gently mobilized. Vascular structures were divided between ligaclips. Care was taken to avoid the recurrent laryngeal nerve. The parathyroid gland was completely excised. It was submitted to pathology where frozen section confirmed hypercellular parathyroid tissue consistent with adenoma.  Neck was irrigated with warm saline and good hemostasis was noted. Fibrillar was placed in the operative field. Strap muscles were approximated in the midline with interrupted 3-0 Vicryl sutures. Platysma was closed with interrupted 3-0 Vicryl sutures. Marcaine was infiltrated circumferentially. Skin was closed with a running 4-0 Monocryl subcuticular suture. Wound was washed and dried and Dermabond was applied. Patient was awakened from anesthesia and brought to the recovery room. The patient tolerated the procedure well.   Oralee Billow, MD [facility] Surgery Office: [phone]

## 2023-09-21 NOTE — Interval H&P Note (Signed)
 History and Physical Interval Note:  09/21/2023 7:03 AM  Dail Drought  has presented today for surgery, with the diagnosis of PRIMARY HYPERPARATHYROIDISM.  The various methods of treatment have been discussed with the patient and family. After consideration of risks, benefits and other options for treatment, the patient has consented to    Procedure(s) with comments: PARATHYROIDECTOMY (Right) - RIGHT PARATHYROIDECTOMY as a surgical intervention.    The patient's history has been reviewed, patient examined, no change in status, stable for surgery.  I have reviewed the patient's chart and labs.  Questions were answered to the patient's satisfaction.    Oralee Billow, MD [facility] practice Office: [phone]   Oralee Billow

## 2023-09-22 ENCOUNTER — Encounter (HOSPITAL_COMMUNITY): Payer: Self-pay | Admitting: Surgery

## 2023-09-27 ENCOUNTER — Ambulatory Visit: Payer: Self-pay | Admitting: Surgery

## 2023-09-27 LAB — SURGICAL PATHOLOGY

## 2023-09-27 NOTE — Progress Notes (Signed)
 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  Will plan to proceed with surgery for parathyroidectomy.  Oralee Billow, MD [facility] practice Office: [phone]

## 2024-03-17 DIAGNOSIS — I10 Essential (primary) hypertension: Secondary | ICD-10-CM | POA: Diagnosis not present

## 2024-03-17 DIAGNOSIS — Z79899 Other long term (current) drug therapy: Secondary | ICD-10-CM | POA: Diagnosis not present

## 2024-03-17 DIAGNOSIS — R946 Abnormal results of thyroid function studies: Secondary | ICD-10-CM | POA: Diagnosis not present

## 2024-03-17 DIAGNOSIS — Z23 Encounter for immunization: Secondary | ICD-10-CM | POA: Diagnosis not present

## 2024-03-17 DIAGNOSIS — E7800 Pure hypercholesterolemia, unspecified: Secondary | ICD-10-CM | POA: Diagnosis not present

## 2024-03-17 DIAGNOSIS — R7309 Other abnormal glucose: Secondary | ICD-10-CM | POA: Diagnosis not present

## 2024-03-17 DIAGNOSIS — R42 Dizziness and giddiness: Secondary | ICD-10-CM | POA: Diagnosis not present

## 2024-03-17 DIAGNOSIS — E559 Vitamin D deficiency, unspecified: Secondary | ICD-10-CM | POA: Diagnosis not present

## 2024-03-18 DIAGNOSIS — M549 Dorsalgia, unspecified: Secondary | ICD-10-CM | POA: Diagnosis not present

## 2024-03-18 DIAGNOSIS — M5417 Radiculopathy, lumbosacral region: Secondary | ICD-10-CM | POA: Diagnosis not present

## 2024-03-18 DIAGNOSIS — G894 Chronic pain syndrome: Secondary | ICD-10-CM | POA: Diagnosis not present

## 2024-03-18 DIAGNOSIS — M7918 Myalgia, other site: Secondary | ICD-10-CM | POA: Diagnosis not present

## 2024-03-26 DIAGNOSIS — E892 Postprocedural hypoparathyroidism: Secondary | ICD-10-CM | POA: Diagnosis not present

## 2024-03-26 DIAGNOSIS — M5126 Other intervertebral disc displacement, lumbar region: Secondary | ICD-10-CM | POA: Diagnosis not present

## 2024-03-26 DIAGNOSIS — Z713 Dietary counseling and surveillance: Secondary | ICD-10-CM | POA: Diagnosis not present

## 2024-03-26 DIAGNOSIS — R7309 Other abnormal glucose: Secondary | ICD-10-CM | POA: Diagnosis not present

## 2024-03-26 DIAGNOSIS — Z0000 Encounter for general adult medical examination without abnormal findings: Secondary | ICD-10-CM | POA: Diagnosis not present

## 2024-04-03 DIAGNOSIS — M5416 Radiculopathy, lumbar region: Secondary | ICD-10-CM | POA: Diagnosis not present
